# Patient Record
Sex: Female | Born: 1970
Health system: Southern US, Community
[De-identification: ages and names within clinical notes are randomized; demographics above are authoritative.]

## PROBLEM LIST (undated history)

## (undated) HISTORY — PX: BREAST BIOPSY: SHX20

## (undated) HISTORY — PX: BREAST EXCISIONAL BIOPSY: SUR124

---

## 2019-04-28 ENCOUNTER — Other Ambulatory Visit: Payer: Self-pay

## 2019-04-28 ENCOUNTER — Ambulatory Visit (HOSPITAL_COMMUNITY)
Admission: EM | Admit: 2019-04-28 | Discharge: 2019-04-28 | Disposition: A | Discharge status: Home / Self Care | Payer: Self-pay | Attending: Family Medicine | Admitting: Family Medicine

## 2019-04-28 ENCOUNTER — Encounter (HOSPITAL_COMMUNITY): Payer: Self-pay

## 2019-04-28 DIAGNOSIS — L237 Allergic contact dermatitis due to plants, except food: Secondary | ICD-10-CM

## 2019-04-28 MED ORDER — METHYLPREDNISOLONE ACETATE 80 MG/ML IJ SUSP
INTRAMUSCULAR | Status: AC
  Filled 2019-04-28: qty 1

## 2019-04-28 MED ORDER — PREDNISONE 20 MG PO TABS: ORAL_TABLET | ORAL | 0 refills | Status: DC

## 2019-04-28 MED ORDER — METHYLPREDNISOLONE ACETATE 40 MG/ML IJ SUSP
40.0000 mg | Freq: Once | INTRAMUSCULAR | Status: AC
  Administered 2019-04-28: 11:00:00 40 mg via INTRAMUSCULAR

## 2019-04-28 NOTE — ED Provider Notes (Signed)
[facility]    CSN: [id number] Arrival date & time: 04/28/19  1021      History   Chief Complaint Chief Complaint  Patient presents with  . Rash  . Poison Ivy    HPI Margaret Cordova is a 48 y.o. female.   She is presenting with a rash.  She is having some facial redness and swelling.  She had a few spots on her neck prior to working last night.  Now it is on her face and her neck and her legs.  She has a history of poison ivy.  Denies any trouble swallowing or changes in vision.  It is itchy but has not taken any medications.  Denies any other exposures.  HPI  History reviewed. No pertinent past medical history.  There are no active problems to display for this patient.   History reviewed. No pertinent surgical history.  OB History   No obstetric history on file.      Home Medications    Prior to Admission medications   Medication Sig Start Date End Date Taking? Authorizing Provider  predniSONE (DELTASONE) 20 MG tablet TAKE 3 TABS ONCE DAILY FOR 3 DAYS, 2 FOR 3 DAYS, 1 FOR 2 DAYS THEN 1/2 FOR 2 DAYS 04/28/19   Myra RudeSchmitz, Nastassia Bazaldua E, MD    Family History Family History  Problem Relation Age of Onset  . Diabetes Mother     Social History Social History   Tobacco Use  . Smoking status: Never Smoker  . Smokeless tobacco: Never Used  Substance Use Topics  . Alcohol use: Not Currently  . Drug use: Not on file     Allergies   Latex   Review of Systems Review of Systems  Constitutional: Negative for fever.  HENT: Positive for facial swelling. Negative for congestion.   Eyes: Negative for redness.  Respiratory: Negative for cough.   Cardiovascular: Negative for chest pain.  Gastrointestinal: Negative for abdominal pain.  Musculoskeletal: Negative for back pain.  Skin: Negative for color change.  Neurological: Negative for weakness.  Hematological: Negative for adenopathy.     Physical Exam Triage Vital Signs ED Triage Vitals  Enc Vitals  Group     BP 04/28/19 1041 128/88     Pulse Rate 04/28/19 1041 79     Resp 04/28/19 1041 15     Temp 04/28/19 1041 98.2 F (36.8 C)     Temp Source 04/28/19 1041 Oral     SpO2 04/28/19 1041 99 %     Weight --      Height --      Head Circumference --      Peak Flow --      Pain Score 04/28/19 1039 0     Pain Loc --      Pain Edu? --      Excl. in GC? --    No data found.  Updated Vital Signs BP 128/88 (BP Location: Right Arm)   Pulse 79   Temp 98.2 F (36.8 C) (Oral)   Resp 15   SpO2 99%   Visual Acuity Right Eye Distance:   Left Eye Distance:   Bilateral Distance:    Right Eye Near:   Left Eye Near:    Bilateral Near:     Physical Exam Gen: NAD, alert, cooperative with exam,  ENT: normal lips, normal nasal mucosa,  Eye: normal EOM, normal conjunctiva and lids CV:  no edema, +2 pedal pulses   Resp: no accessory muscle use,  non-labored,  Skin: Erythematous papules on face covering the left cheek and surrounding orbits., no areas of induration  Neuro: normal tone, normal sensation to touch Psych:  normal insight, alert and oriented MSK: Normal gait, normal strength   UC Treatments / Results  Labs (all labs ordered are listed, but only abnormal results are displayed) Labs Reviewed - No data to display  EKG   Radiology No results found.  Procedures Procedures (including critical care time)  Medications Ordered in UC Medications  methylPREDNISolone acetate (DEPO-MEDROL) injection 40 mg (40 mg Intramuscular Given 04/28/19 1122)  methylPREDNISolone acetate (DEPO-MEDROL) 80 MG/ML injection (has no administration in time range)    Initial Impression / Assessment and Plan / UC Course  I have reviewed the triage vital signs and the nursing notes.  Pertinent labs & imaging results that were available during my care of the patient were reviewed by me and considered in my medical decision making (see chart for details).     Margaret Cordova is a 48 year old female is  presenting with rash.  Symptoms seem to be consistent with poison ivy dermatitis.  It is intensely itchy and is spread since last night.  Provided an intramuscular Depo-Medrol injection in clinic.  Provided prednisone that she may take if symptoms seem to return.  Counseled on supportive care.  Counseled on need for follow-up and return.  Final Clinical Impressions(s) / UC Diagnoses   Final diagnoses:  Poison ivy dermatitis     Discharge Instructions     Please avoid touching your eyes.  Please take zyrtec or allegra for itching during the day. You can take benadryl at night.  Please take the oral prednisone if you notice your symptoms returning.  Please follow up if your symptoms fail to improve.     ED Prescriptions    Medication Sig Dispense Auth. Provider   predniSONE (DELTASONE) 20 MG tablet TAKE 3 TABS ONCE DAILY FOR 3 DAYS, 2 FOR 3 DAYS, 1 FOR 2 DAYS THEN 1/2 FOR 2 DAYS 18 tablet Rosemarie Ax, MD     Controlled Substance Prescriptions [place] Controlled Substance Registry consulted? Not Applicable   Rosemarie Ax, MD 04/28/19 1126

## 2019-04-28 NOTE — Discharge Instructions (Addendum)
Please avoid touching your eyes.  Please take zyrtec or allegra for itching during the day. You can take benadryl at night.  Please take the oral prednisone if you notice your symptoms returning.  Please follow up if your symptoms fail to improve.

## 2019-04-28 NOTE — ED Triage Notes (Signed)
Patient presents to Urgent Care with complaints of poison ivy/oak outbreak on her face, arms, and lower legs since the day before yesterday.

## 2019-06-13 ENCOUNTER — Other Ambulatory Visit: Payer: Self-pay

## 2019-06-13 ENCOUNTER — Ambulatory Visit (INDEPENDENT_AMBULATORY_CARE_PROVIDER_SITE_OTHER): Payer: No Typology Code available for payment source | Admitting: Osteopathic Medicine

## 2019-06-13 ENCOUNTER — Encounter: Payer: Self-pay | Admitting: Osteopathic Medicine

## 2019-06-13 VITALS — BP 131/76 | HR 86 | Temp 98.1°F | Ht 64.0 in | Wt 166.0 lb

## 2019-06-13 DIAGNOSIS — R635 Abnormal weight gain: Secondary | ICD-10-CM | POA: Diagnosis not present

## 2019-06-13 DIAGNOSIS — Z0000 Encounter for general adult medical examination without abnormal findings: Secondary | ICD-10-CM | POA: Diagnosis not present

## 2019-06-13 DIAGNOSIS — Z833 Family history of diabetes mellitus: Secondary | ICD-10-CM | POA: Diagnosis not present

## 2019-06-13 NOTE — Patient Instructions (Addendum)
General Preventive Care  Most recent routine screening lipids/other labs: ordered today   Everyone should have blood pressure checked once per year.   Tobacco: don't!   Alcohol: responsible moderation is ok for most adults - if you have concerns about your alcohol intake, please talk to me!   Exercise: as tolerated to reduce risk of cardiovascular disease and diabetes. Strength training will also prevent osteoporosis.   Mental health: if need for mental health care (medicines, counseling, other), or concerns about moods, please let me know!   Sexual health: if need for STD testing, or if concerns with libido/pain problems, please let me know!   Advanced Directive: Living Will and/or Healthcare Power of Attorney recommended for all adults, regardless of age or health.  Vaccines  Flu vaccine: recommended for almost everyone, every fall.   Shingles vaccine: Shingrix recommended after age 10.   Pneumonia vaccines: Prevnar and Pneumovax recommended after age 77, or sooner if certain medical conditions/smokers  Tetanus booster: Tdap recommended every 10 years.  Cancer screenings   Colon cancer screening: recommended for everyone age 24-75  Breast cancer screening: mammogram recommended age 41-75  Cervical cancer screening: Pap every 1 to 5 years depending on age and other risk factors. Can usually stop at age 76 or w/ hysterectomy.   Lung cancer screening: not needed for non-smokers  Infection screenings . HIV: recommended screening at least once age 33-65, more often as needed. . Gonorrhea/Chlamydia: screening as needed. . Hepatitis C: recommended for anyone born 77-1964 . TB: certain at-risk populations, or depending on work requirements and/or travel history Other  Bone Density Test: recommended for women at age 57            Weight loss: important things to remember  It is hard work! You will have setbacks, but don't get discouraged. The goal is not short-term  success, it is long-term health.   Looking at the numbers is important to track your progress and set goals, but how you are feeling and your overall health are the most important things! BMI and pounds and calories and miles logged aren't everything - they are tools to help [place] reach your goals.  You can do this!!!   Things to remember for exercise for weight loss:   Please note - I am not a certified personal trainer. I can present you with ideas and general workout goals, but an exercise program is largely up to you. Find something you can stick with, and something you enjoy!   As you progress in your exercise regimen think about gradually increasing the following, week by week:   intensity (how strenuous is your workout)  frequency (how often you are exercising)  duration (how many minutes at a time you are exercising)  Walking for 20 minutes a day is certainly better than nothing, but more strenuous exercise will develop better cardiovascular fitness.   interval training (high-intensity alternating with low-intensity, think walk/jog rather than just walk)  muscle strengthening exercises (weight lifting, calisthenics, yoga) - this also helps prevent osteoporosis!   Things to remember for diet changes for weight loss:   Please note - I am not a certified dietician. I can present you with ideas and general diet goals, but a meal plan is largely up to you. I am happy to refer you to a dietician who can give you a detailed meal plan.  Apps/logs are helpful to track how you're eating! It's not realistic to be logging everything you eat forever, but  when you're starting a healthy eating lifestyle it's very helpful, and checking in with logs now and then helps you stick to your program!   Calorie reduction with the goal weight loss of no more than one to one and a half pounds per week.   Increase lean protein such as chicken, fish, [place].   Decrease fatty foods such as dairy, butter.    Decrease sugary foods. Avoid sugary drinks such as soda or juice.  Increase fiber found in fruit and vegetables.   Medications approved for long-term use for obesity  Qsymia (Phentermine and Topiramate)  Saxenda (Liraglutide daily injection), Ozempic (semaglutide weekly injection)  Contrave (Bupropion and Naltrexone)  Orlistat (Xenical, Alli)  Bupropion (Wellbutrin) I recommend that you research the above medications and see which one(s) your insurance may or may not cover: If you call your insurance, ask them specifically what medications are on their formulary that are approved for obesity treatment. They should be able to send you a list or tell you over the phone. Remember, medications aren't magic! You MUST be diligent about lifestyle changes as well!

## 2019-06-13 NOTE — Progress Notes (Signed)
HPI: Margaret Cordova is a 48 y.o. female who  has no past medical history on file.  she presents to [facility] today, 06/13/19,  for chief complaint of: establish care, annual    Patient here for annual physical / wellness exam.  See preventive care reviewed as below.   Patient transferred jobs from [employer] to [employer] and is here to establish care.  Would also like to know more about weight loss treatment. Never been on any Rx before. Regularly exercises and has done a few diet/weight management plans over the past 6 months.     Past medical, surgical, social and family history reviewed:  There are no active problems to display for this patient.  History reviewed. No pertinent surgical history.  Social History   Tobacco Use  . Smoking status: Never Smoker  . Smokeless tobacco: Never Used  Substance Use Topics  . Alcohol use: Never    Frequency: Never    Family History  Problem Relation Age of Onset  . Diabetes Mother   . Diabetes Father      Current medication list and allergy/intolerance information reviewed:    Current Outpatient Medications  Medication Sig Dispense Refill  . predniSONE (DELTASONE) 20 MG tablet TAKE 3 TABS ONCE DAILY FOR 3 DAYS, 2 FOR 3 DAYS, 1 FOR 2 DAYS THEN 1/2 FOR 2 DAYS (Patient not taking: Reported on 06/13/2019) 18 tablet 0   No current facility-administered medications for this visit.     Allergies  Allergen Reactions  . Thimerosal Other (See Comments)    Not sure what type of reaction. Had allergy testing to confirm   . Latex Rash      Review of Systems:  Constitutional:  No  fever, no chills, No recent illness, No unintentional weight changes. No significant fatigue.   HEENT: No  headache, no vision change, no hearing change, No sore throat, No  sinus pressure  Cardiac: No  chest pain, No  pressure, No palpitations, No  Orthopnea  Respiratory:  No  shortness of breath. No   Cough  Gastrointestinal: No  abdominal pain, No  nausea, No  vomiting,  No  blood in stool, No  diarrhea, No  constipation   Musculoskeletal: No new myalgia/arthralgia  Skin: No  Rash, No other wounds/concerning lesions  Genitourinary: No  incontinence, No  abnormal genital bleeding, No abnormal genital discharge  Hem/Onc: No  easy bruising/bleeding, No  abnormal lymph node  Endocrine: No cold intolerance,  No heat intolerance. No polyuria/polydipsia/polyphagia   Neurologic: No  weakness, No  dizziness, No  slurred speech/focal weakness/facial droop  Psychiatric: No  concerns with depression, No  concerns with anxiety, No sleep problems, No mood problems  Exam:  BP 131/76 (BP Location: Left Arm, Patient Position: Sitting, Cuff Size: Normal)   Pulse 86   Temp 98.1 F (36.7 C) (Oral)   Ht 5\' 4"  (1.626 m)   Wt 166 lb 0.6 oz (75.3 kg)   BMI 28.50 kg/m   Constitutional: VS see above. General Appearance: alert, well-developed, well-nourished, NAD  Eyes: Normal lids and conjunctive, non-icteric sclera  Ears, Nose, Mouth, Throat:TM normal bilaterally.  Neck: No masses, trachea midline. No thyroid enlargement. No tenderness/mass appreciated. No lymphadenopathy  Respiratory: Normal respiratory effort. no wheeze, no rhonchi, no rales  Cardiovascular: S1/S2 normal, no murmur, no rub/gallop auscultated. RRR. No lower extremity edema.   Gastrointestinal: Nontender, no masses. No hepatomegaly, no splenomegaly. No hernia appreciated. Rectal exam deferred.  Musculoskeletal: Gait normal. No clubbing/cyanosis of digits.   Neurological: Normal balance/coordination. No tremor. No cranial nerve deficit on limited exam.Cerebellar reflexes intact.   Skin: warm, dry, intact. No rash/ulcer. No concerning nevi or subq nodules on limited exam.    Psychiatric: Normal judgment/insight. Normal mood and affect. Oriented x3.    No results found for this or any previous visit (from the past 72  hour(s)).  No results found.   ASSESSMENT/PLAN: The primary encounter diagnosis was Annual physical exam. Diagnoses of Abnormal weight gain and Family history of diabetes mellitus in first degree relative were also pertinent to this visit.   Weight Loss: Will obtain labs to evaluate TSH and follow up in 4 weeks to discuss weight loss medications. Advised patient to stop biotin supplement 7-10 days prior to obtaining labs.    Preventative: Order mammogram. Declined vaccines due to thimerosal allergy, though this component has largely been removed from most vaccines, will look into this for at least the flu vaccine    Orders Placed This Encounter  Procedures  . CBC  . COMPLETE METABOLIC PANEL WITH GFR  . Lipid panel  . TSH  . Hemoglobin A1c    No orders of the defined types were placed in this encounter.   Patient Instructions  General Preventive Care  Most recent routine screening lipids/other labs: ordered today   Everyone should have blood pressure checked once per year.   Tobacco: don't!   Alcohol: responsible moderation is ok for most adults - if you have concerns about your alcohol intake, please talk to me!   Exercise: as tolerated to reduce risk of cardiovascular disease and diabetes. Strength training will also prevent osteoporosis.   Mental health: if need for mental health care (medicines, counseling, other), or concerns about moods, please let me know!   Sexual health: if need for STD testing, or if concerns with libido/pain problems, please let me know!   Advanced Directive: Living Will and/or Healthcare Power of Attorney recommended for all adults, regardless of age or health.  Vaccines  Flu vaccine: recommended for almost everyone, every fall.   Shingles vaccine: Shingrix recommended after age 13.   Pneumonia vaccines: Prevnar and Pneumovax recommended after age 56, or sooner if certain medical conditions/smokers  Tetanus booster: Tdap recommended every  10 years.  Cancer screenings   Colon cancer screening: recommended for everyone age 51-75  Breast cancer screening: mammogram recommended age 12-75  Cervical cancer screening: Pap every 1 to 5 years depending on age and other risk factors. Can usually stop at age 70 or w/ hysterectomy.   Lung cancer screening: not needed for non-smokers  Infection screenings . HIV: recommended screening at least once age 30-65, more often as needed. . Gonorrhea/Chlamydia: screening as needed. . Hepatitis C: recommended for anyone born 24-1964 . TB: certain at-risk populations, or depending on work requirements and/or travel history Other  Bone Density Test: recommended for women at age 77            Weight loss: important things to remember  It is hard work! You will have setbacks, but don't get discouraged. The goal is not short-term success, it is long-term health.   Looking at the numbers is important to track your progress and set goals, but how you are feeling and your overall health are the most important things! BMI and pounds and calories and miles logged aren't everything - they are tools to help [place] reach your goals.  You can do this!!!  Things to remember for exercise for weight loss:   Please note - I am not a certified personal trainer. I can present you with ideas and general workout goals, but an exercise program is largely up to you. Find something you can stick with, and something you enjoy!   As you progress in your exercise regimen think about gradually increasing the following, week by week:   intensity (how strenuous is your workout)  frequency (how often you are exercising)  duration (how many minutes at a time you are exercising)  Walking for 20 minutes a day is certainly better than nothing, but more strenuous exercise will develop better cardiovascular fitness.   interval training (high-intensity alternating with low-intensity, think walk/jog rather than  just walk)  muscle strengthening exercises (weight lifting, calisthenics, yoga) - this also helps prevent osteoporosis!   Things to remember for diet changes for weight loss:   Please note - I am not a certified dietician. I can present you with ideas and general diet goals, but a meal plan is largely up to you. I am happy to refer you to a dietician who can give you a detailed meal plan.  Apps/logs are helpful to track how you're eating! It's not realistic to be logging everything you eat forever, but when you're starting a healthy eating lifestyle it's very helpful, and checking in with logs now and then helps you stick to your program!   Calorie reduction with the goal weight loss of no more than one to one and a half pounds per week.   Increase lean protein such as chicken, fish, [place].   Decrease fatty foods such as dairy, butter.   Decrease sugary foods. Avoid sugary drinks such as soda or juice.  Increase fiber found in fruit and vegetables.   Medications approved for long-term use for obesity  Qsymia (Phentermine and Topiramate)  Saxenda (Liraglutide daily injection), Ozempic (semaglutide weekly injection)  Contrave (Bupropion and Naltrexone)  Orlistat (Xenical, Alli)  Bupropion (Wellbutrin) I recommend that you research the above medications and see which one(s) your insurance may or may not cover: If you call your insurance, ask them specifically what medications are on their formulary that are approved for obesity treatment. They should be able to send you a list or tell you over the phone. Remember, medications aren't magic! You MUST be diligent about lifestyle changes as well!         Visit summary with medication list and pertinent instructions was printed for patient to review. All questions at time of visit were answered - patient instructed to contact office with any additional concerns or updates. ER/RTC precautions were reviewed with the patient.     Please note: voice recognition software was used to produce this document, and typos may escape review. Please contact Dr. Lyn HollingsheadAlexander for any needed clarifications.     Follow-up plan: Return for if we start phentermine, nurse visit 4 weeks after that for weight and BP check .

## 2019-06-26 ENCOUNTER — Encounter: Payer: Self-pay | Admitting: Osteopathic Medicine

## 2019-06-26 LAB — COMPLETE METABOLIC PANEL WITH GFR
AG Ratio: 1.8 (calc) (ref 1.0–2.5)
ALT: 12 U/L (ref 6–29)
AST: 15 U/L (ref 10–35)
Albumin: 4.3 g/dL (ref 3.6–5.1)
Alkaline phosphatase (APISO): 52 U/L (ref 31–125)
BUN: 11 mg/dL (ref 7–25)
CO2: 26 mmol/L (ref 20–32)
Calcium: 9.8 mg/dL (ref 8.6–10.2)
Chloride: 104 mmol/L (ref 98–110)
Creat: 0.85 mg/dL (ref 0.50–1.10)
GFR, Est African American: 95 mL/min/{1.73_m2} (ref >=60)
GFR, Est Non African American: 82 mL/min/{1.73_m2} (ref >=60)
Globulin: 2.4 g/dL (calc) (ref 1.9–3.7)
Glucose, Bld: 95 mg/dL (ref 65–99)
Potassium: 4.1 mmol/L (ref 3.5–5.3)
Sodium: 139 mmol/L (ref 135–146)
Total Bilirubin: 1 mg/dL (ref 0.2–1.2)
Total Protein: 6.7 g/dL (ref 6.1–8.1)

## 2019-06-26 LAB — LIPID PANEL
Cholesterol: 178 mg/dL (ref <200)
HDL: 57 mg/dL (ref >=50)
LDL Cholesterol (Calc): 85 mg/dL (calc)
Non-HDL Cholesterol (Calc): 121 mg/dL (calc) (ref <130)
Total CHOL/HDL Ratio: 3.1 (calc) (ref <5.0)
Triglycerides: 277 mg/dL — ABNORMAL HIGH (ref <150)

## 2019-06-26 LAB — CBC
HCT: 40.8 % (ref 35.0–45.0)
Hemoglobin: 14.1 g/dL (ref 11.7–15.5)
MCH: 28.5 pg (ref 27.0–33.0)
MCHC: 34.6 g/dL (ref 32.0–36.0)
MCV: 82.6 fL (ref 80.0–100.0)
MPV: 12.9 fL — ABNORMAL HIGH (ref 7.5–12.5)
Platelets: 178 10*3/uL (ref 140–400)
RBC: 4.94 10*6/uL (ref 3.80–5.10)
RDW: 14.8 % (ref 11.0–15.0)
WBC: 7.2 10*3/uL (ref 3.8–10.8)

## 2019-06-26 LAB — HEMOGLOBIN A1C
Hgb A1c MFr Bld: 5.1 % of total Hgb (ref <5.7)
Mean Plasma Glucose: 100 (calc)
eAG (mmol/L): 5.5 (calc)

## 2019-06-26 LAB — TSH: TSH: 0.82 mIU/L

## 2019-06-26 MED ORDER — EPINEPHRINE 0.3 MG/0.3ML IJ SOAJ: 0.3000 mg | Freq: Once | INTRAMUSCULAR | 99 refills | Status: AC

## 2019-06-26 MED FILL — EPINEPHRINE 0.3 MG AUTO-INJ: 0.3 | 2 days supply | Qty: 2 | Fill #0

## 2019-06-28 ENCOUNTER — Encounter: Payer: Self-pay | Admitting: Osteopathic Medicine

## 2019-06-28 MED ORDER — PHENTERMINE HCL 37.5 MG PO TABS: ORAL_TABLET | ORAL | 0 refills | Status: DC

## 2019-06-28 MED FILL — PHENTERMINE 37.5 MG TABLET: 37.5 | 30 days supply | Qty: 30 | Fill #0

## 2019-06-28 NOTE — Addendum Note (Signed)
Addended by: Maryla Morrow on: 06/28/2019 02:33 PM   Modules accepted: Orders

## 2019-07-19 ENCOUNTER — Encounter: Payer: Self-pay | Admitting: Osteopathic Medicine

## 2019-07-26 ENCOUNTER — Ambulatory Visit (INDEPENDENT_AMBULATORY_CARE_PROVIDER_SITE_OTHER): Payer: No Typology Code available for payment source | Admitting: Osteopathic Medicine

## 2019-07-26 ENCOUNTER — Other Ambulatory Visit: Payer: Self-pay

## 2019-07-26 ENCOUNTER — Ambulatory Visit (INDEPENDENT_AMBULATORY_CARE_PROVIDER_SITE_OTHER): Payer: No Typology Code available for payment source

## 2019-07-26 ENCOUNTER — Encounter: Payer: Self-pay | Admitting: Osteopathic Medicine

## 2019-07-26 VITALS — BP 98/61 | HR 79 | Temp 97.8°F | Wt 167.1 lb

## 2019-07-26 DIAGNOSIS — J986 Disorders of diaphragm: Secondary | ICD-10-CM

## 2019-07-26 DIAGNOSIS — M9908 Segmental and somatic dysfunction of rib cage: Secondary | ICD-10-CM | POA: Diagnosis not present

## 2019-07-26 NOTE — Patient Instructions (Signed)
Plan:  I think this is something musculoskeletal w/ diaphragm or intercostal muscles. Ribs don't seem to be moving as freely on that right side. There are some exercises / therapies we might try - let me chat with the physical therapists and see if I can send you some materials for home exercises. Sometimes formal PT or a chiropractor can help with this, too.   Let's get a chest Xray to rule out any other lung/skeletal issue.   OK to take anti-inflammatories (Ibuprofen 400-600 up to 4 times per day) for a week and see if that calms this down. Can also try muscle relaxers - let me know if you'd like a Rx for this!

## 2019-07-26 NOTE — Progress Notes (Signed)
HPI: Margaret Cordova is a 48 y.o. female who  has no past medical history on file.  she presents to [facility] today, 07/26/19,  for chief complaint of:  Rib pain  . Context: no injury or recent illness  . Location: feels like it's inside from the lower ribs on the right, like above the liver / in the diaphragm . Quality: soreness more so than pain, no sharp pains  . Duration: not sure how long it's been bothering her  . Timing: maybe a month, about the same over that time . Modifying factors: tends to be worse with movement: twisting or bending forward  . Assoc signs/symptoms: no CP/SOB, no coughing     At today's visit 07/26/19 ... PMH, PSH, FH reviewed and updated as needed.  Current medication list and allergy/intolerance hx reviewed and updated as needed. (See remainder of HPI, ROS, Phys Exam below)   No results found.  No results found for this or any previous visit (from the past 72 hour(s)).        ASSESSMENT/PLAN: The primary encounter diagnosis was Rib cage dysfunction. A diagnosis of Diaphragm dysfunction was also pertinent to this visit.   Orders Placed This Encounter  Procedures  . DG Chest 2 View     No orders of the defined types were placed in this encounter.   Patient Instructions  Plan:  I think this is something musculoskeletal w/ diaphragm or intercostal muscles. Ribs don't seem to be moving as freely on that right side. There are some exercises / therapies we might try - let me chat with the physical therapists and see if I can send you some materials for home exercises. Sometimes formal PT or a chiropractor can help with this, too.   Let's get a chest Xray to rule out any other lung/skeletal issue.   OK to take anti-inflammatories (Ibuprofen 400-600 up to 4 times per day) for a week and see if that calms this down. Can also try muscle relaxers - let me know if you'd like a Rx for this!        Follow-up  plan: Return if symptoms worsen or fail to improve.                                                 ################################################# ################################################# ################################################# #################################################    No outpatient medications have been marked as taking for the 07/26/19 encounter (Office Visit) with Emeterio Reeve, DO.    Allergies  Allergen Reactions  . Thimerosal Other (See Comments)    Not sure what type of reaction. Had allergy testing to confirm   . Latex Rash       Review of Systems:  Constitutional: No recent illness  HEENT: No  headache, no vision change  Cardiac: No  chest pain, No  pressure, No palpitations  Respiratory:  No  shortness of breath. No  Cough  Gastrointestinal: No  abdominal pain, no change on bowel habits  Musculoskeletal: +new myalgia/arthralgia  Skin: No  Rash  Neurologic: No  weakness, No  Dizziness   Exam:  BP 98/61 (BP Location: Left Arm, Patient Position: Sitting, Cuff Size: Normal)   Pulse 79   Temp 97.8 F (36.6 C) (Oral)   Wt 167 lb 1.9 oz (75.8 kg)   BMI 28.69 kg/m   Constitutional: VS see  above. General Appearance: alert, well-developed, well-nourished, NAD  Eyes: Normal lids and conjunctive, non-icteric sclera  Neck: No masses, trachea midline.   Respiratory: Normal respiratory effort. no wheeze, no rhonchi, no rales  Cardiovascular: S1/S2 normal, no murmur, no rub/gallop auscultated. RRR.   Musculoskeletal: Gait normal. Symmetric and independent movement of all extremities. Rib excursion seems slightly less on the right side w/ inhalation - inhalation against resistance is painful  Abdominal: non-tender, non-distended, no appreciable organomegaly, neg Murphy's, BS WNLx4  Neurological: Normal balance/coordination. No tremor.  Skin: warm, dry, intact.   Psychiatric:  Normal judgment/insight. Normal mood and affect. Oriented x3.       Visit summary with medication list and pertinent instructions was printed for patient to review, patient was advised to alert [place] if any updates are needed. All questions at time of visit were answered - patient instructed to contact office with any additional concerns. ER/RTC precautions were reviewed with the patient and understanding verbalized.     Please note: voice recognition software was used to produce this document, and typos may escape review. Please contact Dr. Lyn Hollingshead for any needed clarifications.    Follow up plan: Return if symptoms worsen or fail to improve.

## 2019-07-29 ENCOUNTER — Encounter: Payer: Self-pay | Admitting: Osteopathic Medicine

## 2019-07-29 DIAGNOSIS — K8020 Calculus of gallbladder without cholecystitis without obstruction: Secondary | ICD-10-CM

## 2019-07-31 ENCOUNTER — Other Ambulatory Visit: Payer: Self-pay

## 2019-07-31 ENCOUNTER — Ambulatory Visit (INDEPENDENT_AMBULATORY_CARE_PROVIDER_SITE_OTHER): Payer: No Typology Code available for payment source

## 2019-07-31 DIAGNOSIS — K8020 Calculus of gallbladder without cholecystitis without obstruction: Secondary | ICD-10-CM

## 2019-08-05 ENCOUNTER — Other Ambulatory Visit (HOSPITAL_BASED_OUTPATIENT_CLINIC_OR_DEPARTMENT_OTHER): Payer: Self-pay | Admitting: Osteopathic Medicine

## 2019-08-05 DIAGNOSIS — Z1231 Encounter for screening mammogram for malignant neoplasm of breast: Secondary | ICD-10-CM

## 2019-08-08 ENCOUNTER — Ambulatory Visit (INDEPENDENT_AMBULATORY_CARE_PROVIDER_SITE_OTHER): Payer: No Typology Code available for payment source

## 2019-08-08 ENCOUNTER — Other Ambulatory Visit: Payer: Self-pay

## 2019-08-08 ENCOUNTER — Other Ambulatory Visit: Payer: Self-pay | Admitting: Osteopathic Medicine

## 2019-08-08 ENCOUNTER — Encounter: Payer: Self-pay | Admitting: Osteopathic Medicine

## 2019-08-08 DIAGNOSIS — Z1231 Encounter for screening mammogram for malignant neoplasm of breast: Secondary | ICD-10-CM

## 2019-08-08 DIAGNOSIS — N6489 Other specified disorders of breast: Secondary | ICD-10-CM

## 2019-08-21 ENCOUNTER — Ambulatory Visit
Admission: RE | Admit: 2019-08-21 | Discharge: 2019-08-21 | Disposition: A | Discharge status: Home / Self Care | Payer: No Typology Code available for payment source | Source: Ambulatory Visit | Attending: Osteopathic Medicine | Admitting: Osteopathic Medicine

## 2019-08-21 ENCOUNTER — Other Ambulatory Visit: Payer: Self-pay

## 2019-08-21 ENCOUNTER — Ambulatory Visit: Payer: No Typology Code available for payment source

## 2019-08-21 DIAGNOSIS — N6489 Other specified disorders of breast: Secondary | ICD-10-CM

## 2019-11-04 ENCOUNTER — Encounter: Payer: Self-pay | Admitting: Medical-Surgical

## 2019-11-18 NOTE — Telephone Encounter (Signed)
Patient overdue for pap according to our records. Please contact to see if she has had this done in the last 3 years. If so, please respond with date and provider that completed it. If not, please offer to schedule appointment for pap smear to be done.  MyChart message sent but unread. 

## 2019-12-23 NOTE — Progress Notes (Addendum)
Virtual Visit via Video Note  I connected with Margaret Cordova on 12/24/19 at  1:00 PM EDT by a video enabled telemedicine application and verified that I am speaking with the correct person using two identifiers.   I discussed the limitations of evaluation and management by telemedicine and the availability of in person appointments. The patient expressed understanding and agreed to proceed.  Patient location: home Provider location: office  Subjective:    CC: possible poison ivy rash  HPI: Pleasant 49 year old female presenting via MyChart video visit for possible poison ivy rash.  Reports that she was outside doing yard work approximately 4 days ago and has since developed a pruritic rash on her face, arms, neck, foot. Reports a few areas of vesicles have developed on arms and one on the face. Has not been using anything over-the-counter. Trying to avoid scratching.  Past medical history, Surgical history, Family history not pertinant except as noted below, Social history, Allergies, and medications have been entered into the medical record, reviewed, and corrections made.   Review of Systems: No fevers, chills, night sweats, weight loss, chest pain, or shortness of breath.   Objective:    General: Speaking clearly in complete sentences without any shortness of breath.  Alert and oriented x3.  Normal judgment. No apparent acute distress.  Impression and Recommendations:    Poison ivy dermatitis Prednisone 12-day taper pack.  May use Benadryl as needed for itching.  Discussed conservative measures including oatmeal baths, calamine lotion, and cool compresses.  Avoid scratching and further exposure.  For future exposures to poison ivy, make sure to shower and change/wash clothes immediately.  Return if symptoms worsen or fail to improve.  25 minutes of non-face-to-face time was provided during this encounter.   I discussed the assessment and treatment plan with the patient. The  patient was provided an opportunity to ask questions and all were answered. The patient agreed with the plan and demonstrated an understanding of the instructions.   The patient was advised to call back or seek an in-person evaluation if the symptoms worsen or if the condition fails to improve as anticipated.  Thayer Ohm, DNP, APRN, FNP-BC [facility]

## 2019-12-24 ENCOUNTER — Encounter: Payer: Self-pay | Admitting: Medical-Surgical

## 2019-12-24 ENCOUNTER — Telehealth (INDEPENDENT_AMBULATORY_CARE_PROVIDER_SITE_OTHER): Payer: No Typology Code available for payment source | Admitting: Medical-Surgical

## 2019-12-24 DIAGNOSIS — Z114 Encounter for screening for human immunodeficiency virus [HIV]: Secondary | ICD-10-CM | POA: Diagnosis not present

## 2019-12-24 DIAGNOSIS — L237 Allergic contact dermatitis due to plants, except food: Secondary | ICD-10-CM | POA: Diagnosis not present

## 2019-12-24 MED ORDER — PREDNISONE 10 MG (48) PO TBPK: ORAL_TABLET | Freq: Every day | ORAL | 0 refills | Status: DC

## 2019-12-24 MED FILL — PREDNISONE 10 MG TABS: 10 | 12 days supply | Qty: 48 | Fill #0

## 2019-12-24 NOTE — Patient Instructions (Signed)
Poison Ivy Dermatitis Poison ivy dermatitis is redness and soreness of the skin caused by chemicals in the leaves of the poison ivy plant. You may have very bad itching, swelling, a rash, and blisters. What are the causes?  Touching a poison ivy plant.  Touching something that has the chemical on it. This may include animals or objects that have come in contact with the plant. What increases the risk?  Going outdoors often in wooded or marshy areas.  Going outdoors without wearing protective clothing, such as closed shoes, long pants, and a long-sleeved shirt. What are the signs or symptoms?   Skin redness.  Very bad itching.  A rash that often includes bumps and blisters. ? The rash usually appears 48 hours after exposure, if you have been exposed before. ? If this is the first time you have been exposed, the rash may not appear until a week after exposure.  Swelling. This may occur if the reaction is very bad. Symptoms usually last for 1-2 weeks. The first time you develop this condition, symptoms may last 3-4 weeks. How is this treated? This condition may be treated with:  Hydrocortisone cream or calamine lotion to relieve itching.  Oatmeal baths to soothe the skin.  Medicines, such as over-the-counter antihistamine tablets.  Oral steroid medicine for more severe reactions. Follow these instructions at home: Medicines  Take or apply over-the-counter and prescription medicines only as told by your doctor.  Use hydrocortisone cream or calamine lotion as needed to help with itching. General instructions  Do not scratch or rub your skin.  Put a cold, wet cloth (cold compress) on the affected areas or take baths in cool water. This will help with itching.  Avoid hot baths and showers.  Take oatmeal baths as needed. Use colloidal oatmeal. You can get this at a pharmacy or grocery store. Follow the instructions on the package.  While you have the rash, wash your clothes  right after you wear them.  Keep all follow-up visits as told by your health care provider. This is important. How is this prevented?   Know what poison ivy looks like, so you can avoid it. ? This plant has three leaves with flowering branches on a single stem. ? The leaves are glossy. ? The leaves have uneven edges that come to a point at the front.  If you touch poison ivy, wash your skin with soap and water right away. Be sure to wash under your fingernails.  When hiking or camping, wear long pants, a long-sleeved shirt, tall socks, and hiking boots. You can also use a lotion on your skin that helps to prevent contact with poison ivy.  If you think that your clothes or outdoor gear came in contact with poison ivy, rinse them off with a garden hose before you bring them inside your house.  When doing yard work or gardening, wear gloves, long sleeves, long pants, and boots. Wash your garden tools and gloves if they come in contact with poison ivy.  If you think that your pet has come into contact with poison ivy, wash him or her with pet shampoo and water. Make sure to wear gloves while washing your pet. Contact a doctor if:  You have open sores in the rash area.  You have more redness, swelling, or pain in the rash area.  You have redness that spreads beyond the rash area.  You have fluid, blood, or pus coming from the rash area.  You have a   fever.  You have a rash over a large area of your body.  You have a rash on your eyes, mouth, or genitals.  Your rash does not get better after a few weeks. Get help right away if:  Your face swells or your eyes swell shut.  You have trouble breathing.  You have trouble swallowing. These symptoms may be an emergency. Do not wait to see if the symptoms will go away. Get medical help right away. Call your local emergency services (911 in the [place] Do not drive yourself to the hospital. Summary  Poison ivy dermatitis is redness and  soreness of the skin caused by chemicals in the leaves of the poison ivy plant.  You may have skin redness, very bad itching, swelling, and a rash.  Do not scratch or rub your skin.  Take or apply over-the-counter and prescription medicines only as told by your doctor. This information is not intended to replace advice given to you by your health care provider. Make sure you discuss any questions you have with your health care provider. Document Revised: 11/30/2018 Document Reviewed: 08/03/2018 Elsevier Patient Education  2020 [employer]  

## 2020-01-15 ENCOUNTER — Encounter: Payer: Self-pay | Admitting: Osteopathic Medicine

## 2020-01-27 ENCOUNTER — Encounter: Payer: Self-pay | Admitting: Osteopathic Medicine

## 2020-02-20 ENCOUNTER — Encounter: Payer: Self-pay | Admitting: Osteopathic Medicine

## 2020-02-20 ENCOUNTER — Telehealth (INDEPENDENT_AMBULATORY_CARE_PROVIDER_SITE_OTHER): Payer: No Typology Code available for payment source | Admitting: Osteopathic Medicine

## 2020-02-20 VITALS — Wt 176.0 lb

## 2020-02-20 DIAGNOSIS — R635 Abnormal weight gain: Secondary | ICD-10-CM

## 2020-02-20 MED ORDER — PHENTERMINE HCL 37.5 MG PO TABS: ORAL_TABLET | ORAL | 0 refills | Status: AC

## 2020-02-20 MED FILL — PHENTERMINE 37.5 MG TABLET: 37.5 | 30 days supply | Qty: 30 | Fill #0

## 2020-02-20 NOTE — Progress Notes (Signed)
Virtual Visit via Video (App used: MyChart) Note  I connected with      Margaret Cordova on 02/20/20 at 8:30 AM  by a telemedicine application and verified that I am speaking with the correct person using two identifiers.  Patient is in the car  I am in office   I discussed the limitations of evaluation and management by telemedicine and the availability of in person appointments. The patient expressed understanding and agreed to proceed.  History of Present Illness: Margaret Cordova is a 49 y.o. female who would like to discuss weight loss goals   07/08/2019 - phentermine x30 days wt about 166 at that time. Was inconsistent about taking the medication 02/20/20 -  Restarted phentermine    Wt Readings from Last 3 Encounters:  02/20/20 176 lb (79.8 kg)  07/26/19 167 lb 1.9 oz (75.8 kg)  06/13/19 166 lb 0.6 oz (75.3 kg)      Observations/Objective: Wt 176 lb (79.8 kg)   LMP 02/09/2020 (Approximate)   BMI 30.21 kg/m  BP Readings from Last 3 Encounters:  07/26/19 98/61  06/13/19 131/76  04/28/19 128/88   Exam: Normal Speech.  NAD  Lab and Radiology Results No results found for this or any previous visit (from the past 72 hour(s)). No results found.     Assessment and Plan: 49 y.o. female with The encounter diagnosis was Abnormal weight gain.   PDMP not reviewed this encounter. No orders of the defined types were placed in this encounter.  Meds ordered this encounter  Medications  . phentermine (ADIPEX-P) 37.5 MG tablet    Sig: One tab by mouth qAM    Dispense:  30 tablet    Refill:  0   Patient Instructions  Weight loss: important things to remember  It is hard work! You will have setbacks, but don't get discouraged. The goal is not short-term success, it is long-term health.   Looking at the numbers is important to track your progress and set goals, but how you are feeling and your overall health are the most important things! BMI and pounds and calories  and miles logged aren't everything - they are tools to help [place] reach your goals.  You can do this!!!   Things to remember for exercise for weight loss:   Please note - I am not a certified personal trainer. I can present you with ideas and general workout goals, but an exercise program is largely up to you. Find something you can stick with, and something you enjoy!   As you progress in your exercise regimen think about gradually increasing the following, week by week:   intensity (how strenuous is your workout)  frequency (how often you are exercising)  duration (how many minutes at a time you are exercising)  Walking for 20 minutes a day is certainly better than nothing, but more strenuous exercise will develop better cardiovascular fitness.   interval training (high-intensity alternating with low-intensity, think walk/jog rather than just walk)  muscle strengthening exercises (weight lifting, calisthenics, yoga) - this also helps prevent osteoporosis!   Things to remember for diet changes for weight loss:   Please note - I am not a certified dietician. I can present you with ideas and general diet goals, but a meal plan is largely up to you. I am happy to refer you to a dietician who can give you a detailed meal plan.  Apps/logs are helpful to track how you're eating! It's not realistic to be  logging everything you eat forever, but when you're starting a healthy eating lifestyle it's very helpful, and checking in with logs now and then helps you stick to your program!   Calorie restriction with the goal weight loss of no more than one to one and a half pounds per week.   Increase lean protein such as chicken, fish, [place].   Decrease fatty foods such as dairy, butter.   Decrease sugary foods. Avoid sugary drinks such as soda or juice.  Increase fiber found in fruit and vegetables.   Medications approved for long-term use for obesity  Qsymia (Phentermine and  Topiramate)  Saxenda (Liraglutide daily) or Weygovy (Liraglutide weekly)   Contrave (Bupropion and Naltrexone)  Orlistat (Xenical, Alli)  Bupropion (Wellbutrin) I recommend that you research the above medications and see which one(s) your insurance may or may not cover: If you call your insurance, ask them specifically what medications are on their formulary that are approved for obesity treatment. They should be able to send you a list or tell you over the phone. Remember, medications aren't magic! You MUST be diligent about lifestyle changes as well!     Instructions sent via MyChart. If MyChart not available, pt was given option for info via personal e-mail w/ no guarantee of protected health info over unsecured e-mail communication, and MyChart sign-up instructions were sent to patient.   Follow Up Instructions: Return in about 4 weeks (around 03/19/2020) for recheck on new weight loss medications - virtual or in office .    I discussed the assessment and treatment plan with the patient. The patient was provided an opportunity to ask questions and all were answered. The patient agreed with the plan and demonstrated an understanding of the instructions.   The patient was advised to call back or seek an in-person evaluation if any new concerns, if symptoms worsen or if the condition fails to improve as anticipated.  20 minutes of non-face-to-face time was provided during this encounter.      . . . . . . . . . . . . . Marland Kitchen                   Historical information moved to improve visibility of documentation.  No past medical history on file. Past Surgical History:  Procedure Laterality Date  . BREAST BIOPSY Left   . BREAST EXCISIONAL BIOPSY Left    Social History   Tobacco Use  . Smoking status: Never Smoker  . Smokeless tobacco: Never Used  Substance Use Topics  . Alcohol use: Never   family history includes Diabetes in her father and  mother.  Medications: Current Outpatient Medications  Medication Sig Dispense Refill  . phentermine (ADIPEX-P) 37.5 MG tablet One tab by mouth qAM 30 tablet 0   No current facility-administered medications for this visit.   Allergies  Allergen Reactions  . Thimerosal Other (See Comments)    Not sure what type of reaction. Had allergy testing to confirm   . Latex Rash

## 2020-02-20 NOTE — Patient Instructions (Signed)
Weight loss: important things to remember  It is hard work! You will have setbacks, but don't get discouraged. The goal is not short-term success, it is long-term health.   Looking at the numbers is important to track your progress and set goals, but how you are feeling and your overall health are the most important things! BMI and pounds and calories and miles logged aren't everything - they are tools to help [place] reach your goals.  You can do this!!!   Things to remember for exercise for weight loss:   Please note - I am not a certified personal trainer. I can present you with ideas and general workout goals, but an exercise program is largely up to you. Find something you can stick with, and something you enjoy!   As you progress in your exercise regimen think about gradually increasing the following, week by week:   intensity (how strenuous is your workout)  frequency (how often you are exercising)  duration (how many minutes at a time you are exercising)  Walking for 20 minutes a day is certainly better than nothing, but more strenuous exercise will develop better cardiovascular fitness.   interval training (high-intensity alternating with low-intensity, think walk/jog rather than just walk)  muscle strengthening exercises (weight lifting, calisthenics, yoga) - this also helps prevent osteoporosis!   Things to remember for diet changes for weight loss:   Please note - I am not a certified dietician. I can present you with ideas and general diet goals, but a meal plan is largely up to you. I am happy to refer you to a dietician who can give you a detailed meal plan.  Apps/logs are helpful to track how you're eating! It's not realistic to be logging everything you eat forever, but when you're starting a healthy eating lifestyle it's very helpful, and checking in with logs now and then helps you stick to your program!   Calorie restriction with the goal weight loss of no more than one  to one and a half pounds per week.   Increase lean protein such as chicken, fish, [place].   Decrease fatty foods such as dairy, butter.   Decrease sugary foods. Avoid sugary drinks such as soda or juice.  Increase fiber found in fruit and vegetables.   Medications approved for long-term use for obesity  Qsymia (Phentermine and Topiramate)  Saxenda (Liraglutide daily) or Weygovy (Liraglutide weekly)   Contrave (Bupropion and Naltrexone)  Orlistat (Xenical, Alli)  Bupropion (Wellbutrin) I recommend that you research the above medications and see which one(s) your insurance may or may not cover: If you call your insurance, ask them specifically what medications are on their formulary that are approved for obesity treatment. They should be able to send you a list or tell you over the phone. Remember, medications aren't magic! You MUST be diligent about lifestyle changes as well!

## 2021-12-25 IMAGING — MG MM DIGITAL DIAGNOSTIC UNILAT*L* W/ TOMO W/ CAD
4 series · 4 of 12 positions shown · non-contrast
Comparison: Previous exam(s).

CLINICAL DATA: Screening recall for possible left breast asymmetry.

EXAM:
DIGITAL DIAGNOSTIC UNILATERAL LEFT MAMMOGRAM WITH CAD AND TOMO

[L ML synth-2D]
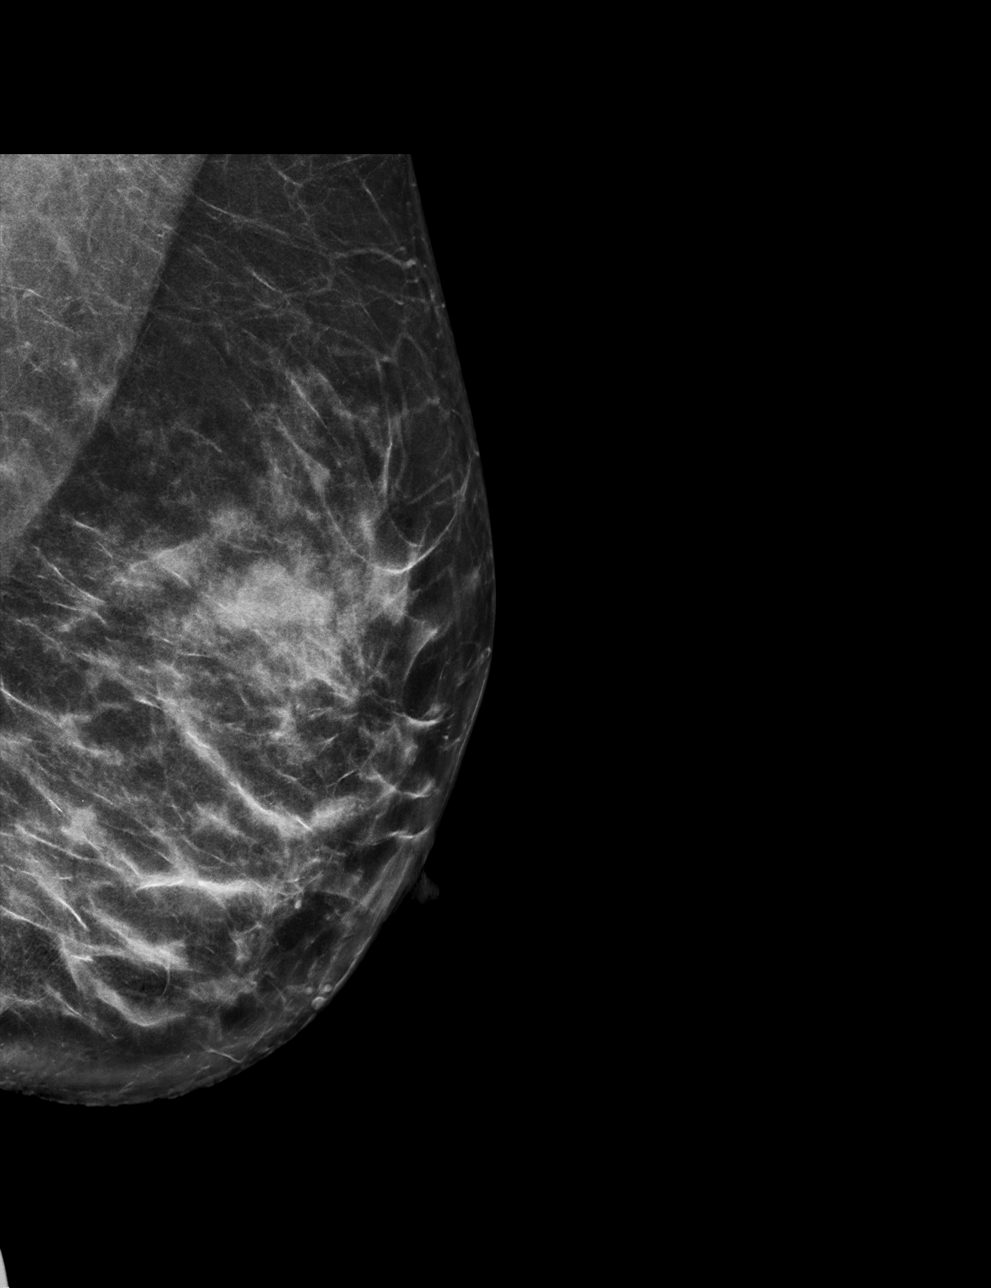

[L CC synth-2D]
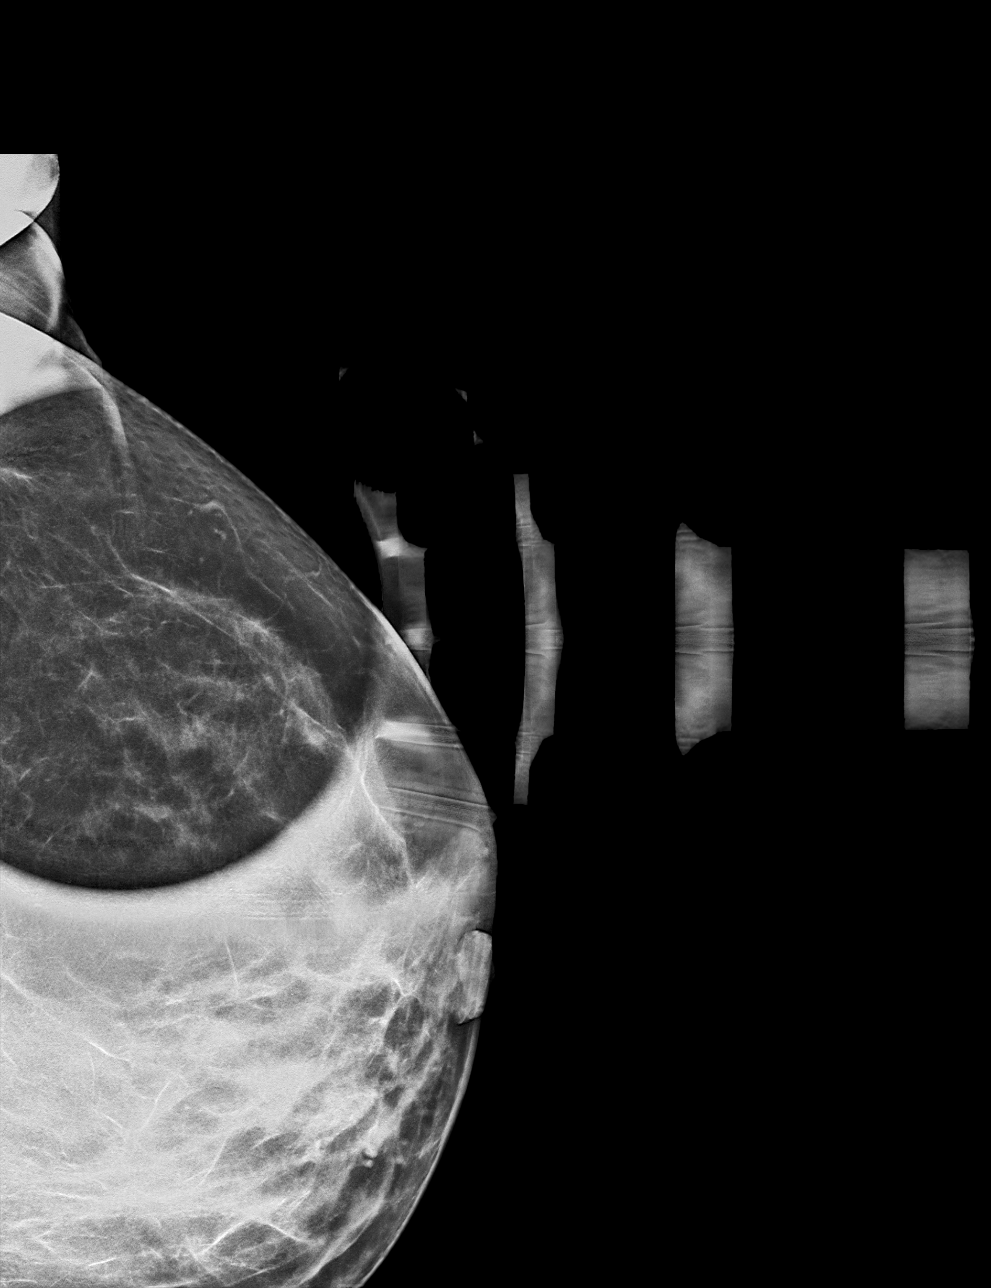

[L ML tomo · tomo slice 35/69.0]
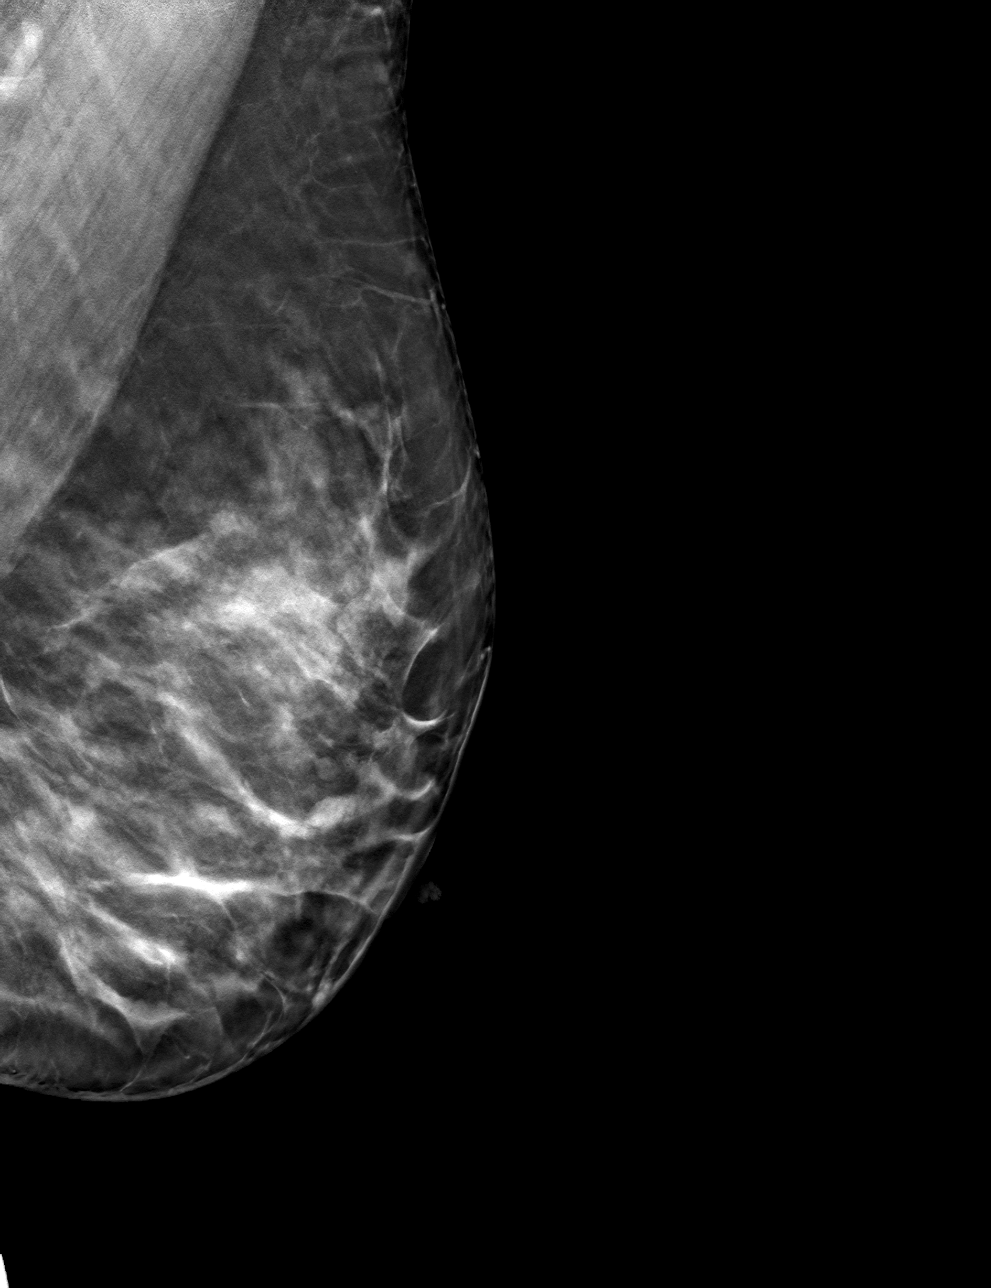

[L CC tomo · tomo slice 29/58.0]
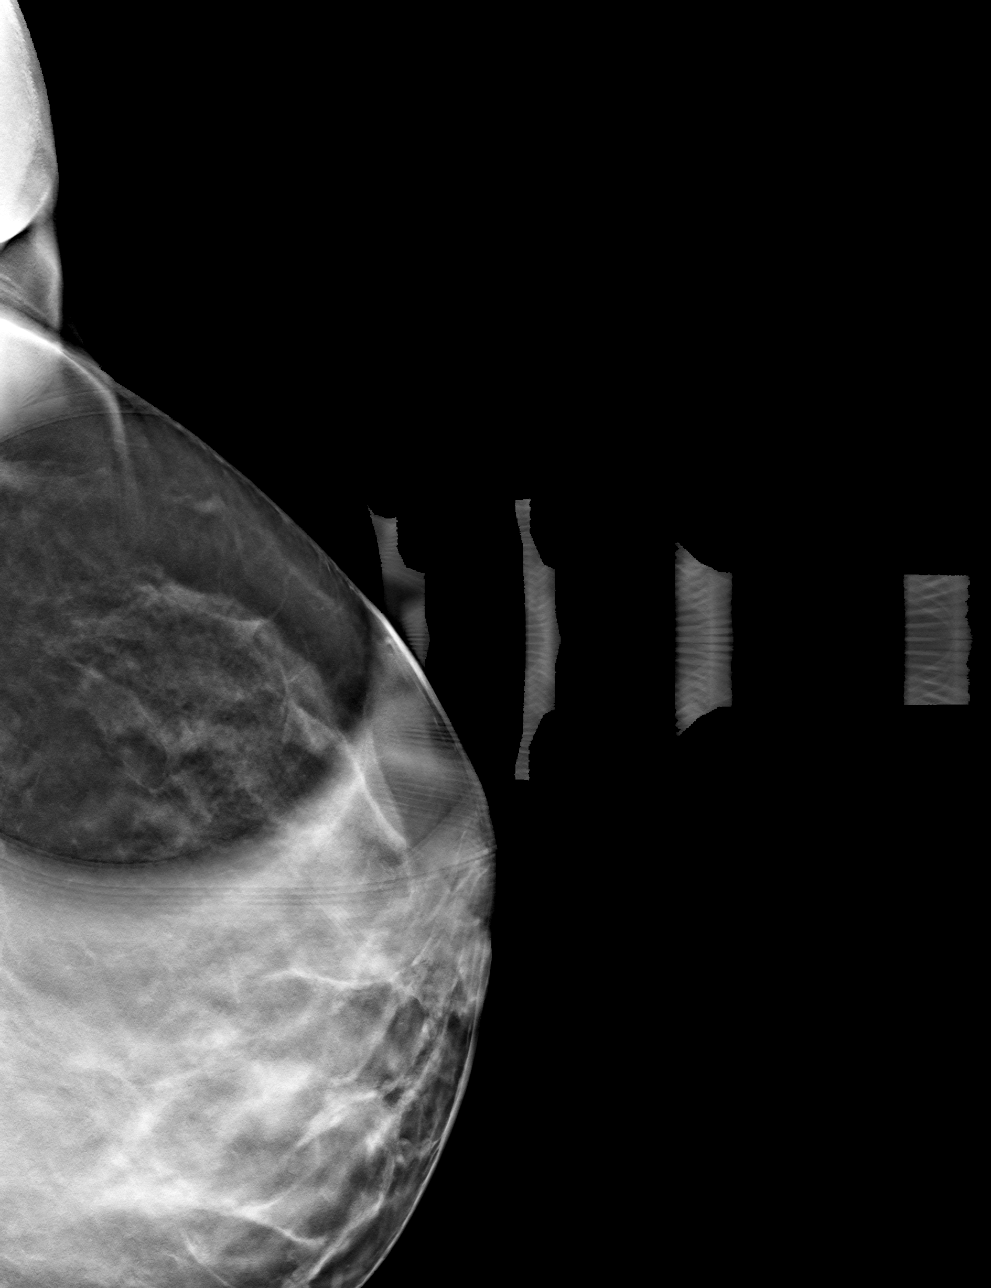

[4 of 12 positions shown; findings below may reference images not displayed]

ACR Breast Density Category c: The breast tissue is heterogeneously
dense, which may obscure small masses.
FINDINGS: The possible asymmetry noted the left breast cc view laterally
disperses with spot compression imaging. This has interspersed fat
consistent with asymmetric, but otherwise normal, fibroglandular
tissue. There is no underlying mass. There is no architectural
distortion and there are no suspicious calcifications.

Mammographic images were processed with CAD.
IMPRESSION: 1. No evidence of breast malignancy.
2. Area of asymmetric fibroglandular tissue in the lateral left
breast.

RECOMMENDATION:
Screening mammogram in one year.(Code:[id number])

I have discussed the findings and recommendations with the patient.
If applicable, a reminder letter will be sent to the patient
regarding the next appointment.

BI-RADS CATEGORY  1: Negative.

## 2022-06-30 DIAGNOSIS — Z0000 Encounter for general adult medical examination without abnormal findings: Secondary | ICD-10-CM | POA: Diagnosis not present

## 2022-06-30 DIAGNOSIS — Z124 Encounter for screening for malignant neoplasm of cervix: Secondary | ICD-10-CM | POA: Diagnosis not present

## 2022-06-30 DIAGNOSIS — Z1231 Encounter for screening mammogram for malignant neoplasm of breast: Secondary | ICD-10-CM | POA: Diagnosis not present

## 2022-06-30 DIAGNOSIS — Z113 Encounter for screening for infections with a predominantly sexual mode of transmission: Secondary | ICD-10-CM | POA: Diagnosis not present

## 2022-07-18 DIAGNOSIS — Z1231 Encounter for screening mammogram for malignant neoplasm of breast: Secondary | ICD-10-CM | POA: Diagnosis not present

## 2023-03-07 ENCOUNTER — Encounter: Payer: Self-pay | Admitting: Osteopathic Medicine
